# Patient Record
Sex: Male | Born: 1935 | Race: White | Hispanic: No | Smoking: Never smoker
Health system: Southern US, Community
[De-identification: ages and names within clinical notes are randomized; demographics above are authoritative.]

## PROBLEM LIST (undated history)

## (undated) DIAGNOSIS — A809 Acute poliomyelitis, unspecified: Secondary | ICD-10-CM

## (undated) DIAGNOSIS — R413 Other amnesia: Secondary | ICD-10-CM

## (undated) DIAGNOSIS — E7800 Pure hypercholesterolemia, unspecified: Secondary | ICD-10-CM

## (undated) HISTORY — PX: PROSTATE SURGERY: SHX751

---

## 2014-01-05 DIAGNOSIS — H9190 Unspecified hearing loss, unspecified ear: Secondary | ICD-10-CM | POA: Insufficient documentation

## 2014-01-05 DIAGNOSIS — I10 Essential (primary) hypertension: Secondary | ICD-10-CM | POA: Insufficient documentation

## 2014-01-05 DIAGNOSIS — R32 Unspecified urinary incontinence: Secondary | ICD-10-CM | POA: Insufficient documentation

## 2014-01-05 DIAGNOSIS — H6120 Impacted cerumen, unspecified ear: Secondary | ICD-10-CM | POA: Insufficient documentation

## 2014-01-05 DIAGNOSIS — E785 Hyperlipidemia, unspecified: Secondary | ICD-10-CM | POA: Insufficient documentation

## 2014-01-05 DIAGNOSIS — H9113 Presbycusis, bilateral: Secondary | ICD-10-CM | POA: Insufficient documentation

## 2014-01-05 DIAGNOSIS — H6500 Acute serous otitis media, unspecified ear: Secondary | ICD-10-CM | POA: Insufficient documentation

## 2014-01-05 DIAGNOSIS — R339 Retention of urine, unspecified: Secondary | ICD-10-CM | POA: Insufficient documentation

## 2015-06-12 DIAGNOSIS — R413 Other amnesia: Secondary | ICD-10-CM | POA: Insufficient documentation

## 2015-06-12 DIAGNOSIS — I493 Ventricular premature depolarization: Secondary | ICD-10-CM | POA: Insufficient documentation

## 2015-06-12 DIAGNOSIS — R918 Other nonspecific abnormal finding of lung field: Secondary | ICD-10-CM | POA: Insufficient documentation

## 2016-01-28 DIAGNOSIS — R002 Palpitations: Secondary | ICD-10-CM | POA: Insufficient documentation

## 2016-01-28 DIAGNOSIS — Z23 Encounter for immunization: Secondary | ICD-10-CM | POA: Insufficient documentation

## 2016-08-22 ENCOUNTER — Emergency Department (HOSPITAL_BASED_OUTPATIENT_CLINIC_OR_DEPARTMENT_OTHER): Payer: Medicare Other

## 2016-08-22 ENCOUNTER — Emergency Department (HOSPITAL_BASED_OUTPATIENT_CLINIC_OR_DEPARTMENT_OTHER)
Admission: EM | Admit: 2016-08-22 | Discharge: 2016-08-22 | Disposition: A | Discharge status: Home / Self Care | Payer: Medicare Other | Attending: Emergency Medicine | Admitting: Emergency Medicine

## 2016-08-22 ENCOUNTER — Encounter (HOSPITAL_BASED_OUTPATIENT_CLINIC_OR_DEPARTMENT_OTHER): Payer: Self-pay | Admitting: *Deleted

## 2016-08-22 DIAGNOSIS — W101XXA Fall (on)(from) sidewalk curb, initial encounter: Secondary | ICD-10-CM | POA: Diagnosis not present

## 2016-08-22 DIAGNOSIS — Y939 Activity, unspecified: Secondary | ICD-10-CM | POA: Diagnosis not present

## 2016-08-22 DIAGNOSIS — S01111A Laceration without foreign body of right eyelid and periocular area, initial encounter: Secondary | ICD-10-CM | POA: Diagnosis not present

## 2016-08-22 DIAGNOSIS — Y999 Unspecified external cause status: Secondary | ICD-10-CM | POA: Insufficient documentation

## 2016-08-22 DIAGNOSIS — S52044A Nondisplaced fracture of coronoid process of right ulna, initial encounter for closed fracture: Secondary | ICD-10-CM | POA: Insufficient documentation

## 2016-08-22 DIAGNOSIS — F0390 Unspecified dementia without behavioral disturbance: Secondary | ICD-10-CM | POA: Diagnosis not present

## 2016-08-22 DIAGNOSIS — S0990XA Unspecified injury of head, initial encounter: Secondary | ICD-10-CM | POA: Diagnosis not present

## 2016-08-22 DIAGNOSIS — Z23 Encounter for immunization: Secondary | ICD-10-CM | POA: Insufficient documentation

## 2016-08-22 DIAGNOSIS — S6991XA Unspecified injury of right wrist, hand and finger(s), initial encounter: Secondary | ICD-10-CM | POA: Diagnosis present

## 2016-08-22 DIAGNOSIS — W19XXXA Unspecified fall, initial encounter: Secondary | ICD-10-CM

## 2016-08-22 DIAGNOSIS — Y92480 Sidewalk as the place of occurrence of the external cause: Secondary | ICD-10-CM | POA: Diagnosis not present

## 2016-08-22 DIAGNOSIS — Z79899 Other long term (current) drug therapy: Secondary | ICD-10-CM | POA: Insufficient documentation

## 2016-08-22 HISTORY — DX: Acute poliomyelitis, unspecified: A80.9

## 2016-08-22 HISTORY — DX: Other amnesia: R41.3

## 2016-08-22 HISTORY — DX: Pure hypercholesterolemia, unspecified: E78.00

## 2016-08-22 MED ORDER — CYCLOBENZAPRINE HCL 5 MG PO TABS
5.0000 mg | ORAL_TABLET | Freq: Once | ORAL | Status: AC
  Administered 2016-08-22: 5 mg via ORAL
  Filled 2016-08-22: qty 1

## 2016-08-22 MED ORDER — TETANUS-DIPHTH-ACELL PERTUSSIS 5-2.5-18.5 LF-MCG/0.5 IM SUSP
0.5000 mL | Freq: Once | INTRAMUSCULAR | Status: AC
  Administered 2016-08-22: 0.5 mL via INTRAMUSCULAR
  Filled 2016-08-22: qty 0.5

## 2016-08-22 MED ORDER — CYCLOBENZAPRINE HCL 10 MG PO TABS: 10.0000 mg | ORAL_TABLET | Freq: Two times a day (BID) | ORAL | 0 refills | Status: AC | PRN

## 2016-08-22 NOTE — ED Provider Notes (Addendum)
[place] [facility] Provider Note   CSN: [id number] Arrival date & time: 08/22/16  1122     History   Chief Complaint Chief Complaint  Patient presents with  . Fall    HPI Dustin Villarreal is a 83 y.o. male.  Patient status post fall outside shortly prior to arrival. Patient slipped off a curb. No loss of consciousness no syncope. Patient with complaint of right elbow pain and abrasion to his right eyebrow area. Again no loss of consciousness. Patient not on blood thinners. Patient denies any leg or hip pain. Also denies any back or neck pain. Tetanus is not up-to-date. Patient has a history of some dementia. He is a retired Engineer, drilling. 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      Past Medical History:  Diagnosis Date  . Hypercholesteremia   . Memory loss   . Polio     There are no active problems to display for this patient.   Past Surgical History:  Procedure Laterality Date  . PROSTATE SURGERY         Home Medications    Prior to Admission medications   Medication Sig Start Date End Date Taking? Authorizing Provider  atorvastatin (LIPITOR) 20 MG tablet Take 20 mg by mouth daily.   Yes Historical Provider, MD  cyclobenzaprine (FLEXERIL) 10 MG tablet Take 1 tablet (10 mg total) by mouth 2 (two) times daily as needed for muscle spasms. 08/22/16   Fredia Sorrow, MD    Family History History reviewed. No pertinent family history.  Social History Social History  Substance Use Topics  . Smoking status: Never Smoker  . Smokeless tobacco: Never Used  . Alcohol use No     Allergies   Patient has no known allergies.   Review of Systems Review of Systems  Constitutional: Negative for diaphoresis.  HENT: Positive for facial swelling. Negative for trouble swallowing.   Eyes: Negative for redness.  Respiratory: Negative for shortness of breath.   Cardiovascular: Negative for chest pain.  Gastrointestinal: Negative  for abdominal pain.  Musculoskeletal: Negative for back pain and neck pain.  Skin: Positive for wound.  Neurological: Positive for headaches.  Hematological: Does not bruise/bleed easily.  Psychiatric/Behavioral: Positive for confusion.     Physical Exam Updated Vital Signs BP (!) 165/83 (BP Location: Left Arm)   Pulse 61   Temp 97.8 F (36.6 C) (Oral)   Resp 14   Ht 5\' 6"  (1.676 m)   Wt 68 kg   SpO2 100%   BMI 24.21 kg/m   Physical Exam  Constitutional: He appears well-developed and well-nourished. No distress.  HENT:  Head: Normocephalic.  Contusion and 7 mm laceration to the right eyebrow.  Eyes: Conjunctivae and EOM are normal. Pupils are equal, round, and reactive to light.  Neck: Normal range of motion. Neck supple.  No posterior tenderness to palpation to the cervical spine area.  Cardiovascular: Normal rate and regular rhythm.   Pulmonary/Chest: Effort normal and breath sounds normal. No respiratory distress.  Abdominal: Soft. Bowel sounds are normal. There is no tenderness.  Musculoskeletal: He exhibits edema and tenderness.  Tenderness to palpation with some swelling in the right antecubital area. Good range of motion of the right elbow but not able to get full extension. Biceps muscle appears to be intact. Distally radial pulse is 2+. Good range of motion of the fingers. Sensation intact distally.  Neurological: He is alert. No cranial nerve deficit or sensory deficit.  He exhibits normal muscle tone. Coordination normal.  Patient has some baseline weakness to left leg secondary to polio.  Nursing note and vitals reviewed.    ED Treatments / Results  Labs (all labs ordered are listed, but only abnormal results are displayed) Labs Reviewed - No data to display  EKG  EKG Interpretation None       Radiology Dg Elbow Complete Right  Result Date: 08/22/2016 CLINICAL DATA:  Acute right elbow pain following fall today. Initial encounter. EXAM: RIGHT ELBOW -  COMPLETE 3+ VIEW COMPARISON:  None. FINDINGS: There is a nondisplaced coronoid process fracture of the ulna. An elbow effusion is noted. No subluxation or dislocation identified. IMPRESSION: Coronoid process fracture. Electronically Signed   By: Margarette Canada M.D.   On: 08/22/2016 13:19   Ct Head Wo Contrast  Result Date: 08/22/2016 CLINICAL DATA:  83 year old male with fall and head injury today. Initial encounter. EXAM: CT HEAD WITHOUT CONTRAST TECHNIQUE: Contiguous axial images were obtained from the base of the skull through the vertex without intravenous contrast. COMPARISON:  None. FINDINGS: Brain: No evidence of acute infarction, hemorrhage, hydrocephalus, extra-axial collection or mass lesion/mass effect. Mild atrophy, chronic small-vessel white matter ischemic changes and remote bilateral basal ganglia infarcts noted. Vascular: Intracranial atherosclerotic calcifications noted. Skull: Normal. Negative for fracture or focal lesion. Sinuses/Orbits: No acute finding. Other: None. IMPRESSION: No evidence of acute intracranial abnormality. Atrophy, chronic small-vessel white matter ischemic changes and remote bilateral basal ganglia infarcts. Electronically Signed   By: Margarette Canada M.D.   On: 08/22/2016 13:38    Procedures Procedures (including critical care time)  Medications Ordered in ED Medications  Tdap (BOOSTRIX) injection 0.5 mL (not administered)  cyclobenzaprine (FLEXERIL) tablet 5 mg (5 mg Oral Given 08/22/16 1439)     Initial Impression / Assessment and Plan / ED Course  I have reviewed the triage vital signs and the nursing notes.  Pertinent labs & imaging results that were available during my care of the patient were reviewed by me and considered in my medical decision making (see chart for details).    Patient status post fall outside no loss of consciousness however did hit right eyebrow. Patient does have some baseline dementia. Patient with a 7 mm laceration to the right  eyebrow area sealed not actively bleeding. Also with complaint of right elbow pain.  Workup shows evidence of a type I nondisplaced coronoid process fracture of the right ulnar. Discussed with Dr. Ninfa Linden. Recommends long-arm 90 splint and follow-up in his office in one week. Patient feels much better after his splinting. Also given a sling to support the splint. Post-splinting Refill to the fingers is normal. Splint done by technician.  Since the fall occurred outside tetanus needs to be updated. Laceration does not require suturing will heal on its own. Head CT was negative. No other significant injuries clinically.  Final Clinical Impressions(s) / ED Diagnoses   Final diagnoses:  Fall, initial encounter  Eyebrow laceration, right, initial encounter  Injury of head, initial encounter  Dementia without behavioral disturbance, unspecified dementia type  Nondisplaced fracture of coronoid process of right ulna, initial encounter for closed fracture    New Prescriptions New Prescriptions   CYCLOBENZAPRINE (FLEXERIL) 10 MG TABLET    Take 1 tablet (10 mg total) by mouth 2 (two) times daily as needed for muscle spasms.     Fredia Sorrow, MD 08/22/16 [place], MD 08/22/16 1558

## 2016-08-22 NOTE — ED Triage Notes (Signed)
Pt slipped off a curb.  Reports right elbow pain and small abrasion above right eye-no bleeding noted.  Denies LOC, no blood thinners.  Denies hip or knee pain.

## 2016-08-22 NOTE — ED Notes (Signed)
Pt transported to CT/Xray. 

## 2016-08-22 NOTE — Discharge Instructions (Signed)
Follow-up with Dr. Ninfa Linden from orthopedics in about one week. Keep the splint in place. Use the sling to help support the splint. Take the Flexeril as needed. Return for any new or worse symptoms. Head CT was negative. Tetanus updated today. Laceration to the right eyebrow area will heal on its own.

## 2016-08-28 ENCOUNTER — Telehealth (INDEPENDENT_AMBULATORY_CARE_PROVIDER_SITE_OTHER): Payer: Self-pay

## 2016-08-28 NOTE — Telephone Encounter (Signed)
This patient is coming in to see you Monday, do I need to bring cast stuff or will he need some sort of splint or anything at all?

## 2016-08-28 NOTE — Telephone Encounter (Signed)
You don't need to bring anything for this one.

## 2016-08-31 ENCOUNTER — Ambulatory Visit (INDEPENDENT_AMBULATORY_CARE_PROVIDER_SITE_OTHER): Payer: Medicare Other | Admitting: Orthopaedic Surgery

## 2016-08-31 DIAGNOSIS — S52044A Nondisplaced fracture of coronoid process of right ulna, initial encounter for closed fracture: Secondary | ICD-10-CM | POA: Insufficient documentation

## 2016-08-31 NOTE — Progress Notes (Signed)
   Office Visit Note   Patient: Dustin Villarreal           Date of Birth: [birth year]           MRN: [id number] Visit Date: 08/31/2016              Requested by: [address] [place], [place] [address] PCP: [place]: Visit Diagnoses:  1. Nondisplaced fracture of coronoid process of right ulna, initial encounter for closed fracture     Plan: We reviewed the x-rays together and I showed  Follow-Up Instructions: Return in about 4 weeks (around 09/28/2016).   Orders:  No orders of the defined types were placed in this encounter.  No orders of the defined types were placed in this encounter.     Procedures: No procedures performed   Clinical Data: No additional findings.   Subjective: No chief complaint on file. Patient is a very pleasant 83 year old gentleman referred from the emergency room due to a right elbow injury. He is in a splint. He injured this after mechanical fall. His daughter is with him. He's been dealing with some dementia issues and some balance and coronation issues. He has had polio in the past. He does report elbow pain but is been comfortable in the splint since his fall. A bigger concern though is his issues with balance and coordination and him being a fall risk.  HPI  Review of Systems Currently denies any chest pain, headache, shortness of breath, fever, chills, nausea, vomiting. There are balance and coordination issues.  Objective: Vital Signs: There were no vitals taken for this visit.  Physical Exam He is alert and follows commands. He ambulates with an assistive device. Ortho Exam He has extensive bruising around his right elbow. He lacks full extension by about 10 and he can fully flex. Rotation is normal. He has normal sensation in his hand and his hand is well-perfused Specialty Comments:  No specialty comments available.  Imaging: No  results found. X-rays on the canopy System and independently reviewed of his right elbow show a type I coronoid fracture with no displacement. He does have cortical irregularities around the biceps tuberosity of the radius to suggest an old injury to the distal biceps tendon.  PMFS History: Patient Active Problem List   Diagnosis Date Noted  . Nondisplaced fracture of coronoid process of right ulna, initial encounter for closed fracture 08/31/2016   Past Medical History:  Diagnosis Date  . Hypercholesteremia   . Memory loss   . Polio     No family history on file.  Past Surgical History:  Procedure Laterality Date  . PROSTATE SURGERY     Social History   Occupational History  . Not on file.   Social History Main Topics  . Smoking status: Never Smoker  . Smokeless tobacco: Never Used  . Alcohol use No  . Drug use: No  . Sexual activity: Not on file

## 2016-09-11 ENCOUNTER — Other Ambulatory Visit (INDEPENDENT_AMBULATORY_CARE_PROVIDER_SITE_OTHER): Payer: Self-pay

## 2016-09-11 DIAGNOSIS — M25521 Pain in right elbow: Secondary | ICD-10-CM

## 2016-09-14 ENCOUNTER — Ambulatory Visit (HOSPITAL_BASED_OUTPATIENT_CLINIC_OR_DEPARTMENT_OTHER)
Admission: RE | Admit: 2016-09-14 | Discharge: 2016-09-14 | Disposition: A | Discharge status: Home / Self Care | Payer: Medicare Other | Source: Ambulatory Visit | Attending: Orthopaedic Surgery | Admitting: Orthopaedic Surgery

## 2016-09-14 ENCOUNTER — Ambulatory Visit (INDEPENDENT_AMBULATORY_CARE_PROVIDER_SITE_OTHER): Payer: Medicare Other | Admitting: Orthopaedic Surgery

## 2016-09-14 DIAGNOSIS — M25521 Pain in right elbow: Secondary | ICD-10-CM | POA: Insufficient documentation

## 2016-09-14 DIAGNOSIS — S52044A Nondisplaced fracture of coronoid process of right ulna, initial encounter for closed fracture: Secondary | ICD-10-CM

## 2016-09-14 DIAGNOSIS — S52001A Unspecified fracture of upper end of right ulna, initial encounter for closed fracture: Secondary | ICD-10-CM | POA: Diagnosis not present

## 2016-09-14 DIAGNOSIS — X58XXXA Exposure to other specified factors, initial encounter: Secondary | ICD-10-CM | POA: Diagnosis not present

## 2016-09-14 DIAGNOSIS — S42401A Unspecified fracture of lower end of right humerus, initial encounter for closed fracture: Secondary | ICD-10-CM | POA: Insufficient documentation

## 2016-09-14 NOTE — Progress Notes (Signed)
The patient is now 3 weeks out from a right elbow injury. He was originally diagnoses nondisplaced coronoid fracture of the proximal ulna. He's been out of the sling now is still having pain. He had a fall again out of bed this weekend.  On examination the elbow is swollen and there is some old bruising. His range of motion though is improved with rotation as well as flexion extension. He lacks full extension by about 10. It is less painful than it was.  X-rays were obtained today and reviewed. The coronoid pieces completely healed down in good position however he can see a nondisplaced fracture line through the distal humerus is more a transcondylar type of fracture that is extra articular. It is nondisplaced. There shows some healing.  He'll still try to use his elbow in terms of range of motion and activities daily living without heavy lifting. I would like to see him back for of left file visit in 4 weeks with a repeat AP and lateral of his right elbow.

## 2016-09-15 ENCOUNTER — Telehealth (INDEPENDENT_AMBULATORY_CARE_PROVIDER_SITE_OTHER): Payer: Self-pay | Admitting: Orthopaedic Surgery

## 2016-09-15 NOTE — Telephone Encounter (Signed)
[facility] requesting visit notes Supporting pt prescription for a walker please.  [phone] fax

## 2016-09-15 NOTE — Telephone Encounter (Signed)
Faxed

## 2016-09-28 ENCOUNTER — Ambulatory Visit (INDEPENDENT_AMBULATORY_CARE_PROVIDER_SITE_OTHER): Payer: Medicare Other | Admitting: Orthopaedic Surgery

## 2016-10-12 ENCOUNTER — Ambulatory Visit (INDEPENDENT_AMBULATORY_CARE_PROVIDER_SITE_OTHER): Payer: Medicare Other | Admitting: Orthopaedic Surgery

## 2017-05-26 DIAGNOSIS — R0989 Other specified symptoms and signs involving the circulatory and respiratory systems: Secondary | ICD-10-CM | POA: Insufficient documentation

## 2017-06-23 DIAGNOSIS — D1030 Benign neoplasm of unspecified part of mouth: Secondary | ICD-10-CM | POA: Insufficient documentation

## 2018-08-15 DIAGNOSIS — S72142A Displaced intertrochanteric fracture of left femur, initial encounter for closed fracture: Secondary | ICD-10-CM | POA: Insufficient documentation

## 2018-08-23 DIAGNOSIS — M25552 Pain in left hip: Secondary | ICD-10-CM | POA: Insufficient documentation

## 2018-09-09 DIAGNOSIS — T17900A Unspecified foreign body in respiratory tract, part unspecified causing asphyxiation, initial encounter: Secondary | ICD-10-CM | POA: Insufficient documentation

## 2018-09-12 DIAGNOSIS — G40909 Epilepsy, unspecified, not intractable, without status epilepticus: Secondary | ICD-10-CM | POA: Insufficient documentation

## 2018-09-12 DIAGNOSIS — Z7409 Other reduced mobility: Secondary | ICD-10-CM | POA: Insufficient documentation

## 2018-09-12 DIAGNOSIS — Z8612 Personal history of poliomyelitis: Secondary | ICD-10-CM | POA: Insufficient documentation

## 2018-10-05 DIAGNOSIS — Z8781 Personal history of (healed) traumatic fracture: Secondary | ICD-10-CM | POA: Insufficient documentation

## 2018-10-11 DIAGNOSIS — I6521 Occlusion and stenosis of right carotid artery: Secondary | ICD-10-CM | POA: Insufficient documentation

## 2019-04-12 IMAGING — DX DG ELBOW COMPLETE 3+V*R*
4 series · 4 of 4 positions shown · non-contrast
Comparison: None.

CLINICAL DATA: Right elbow pain after fall last week.

EXAM:
RIGHT ELBOW - COMPLETE 3+ VIEW

[elbow ap]
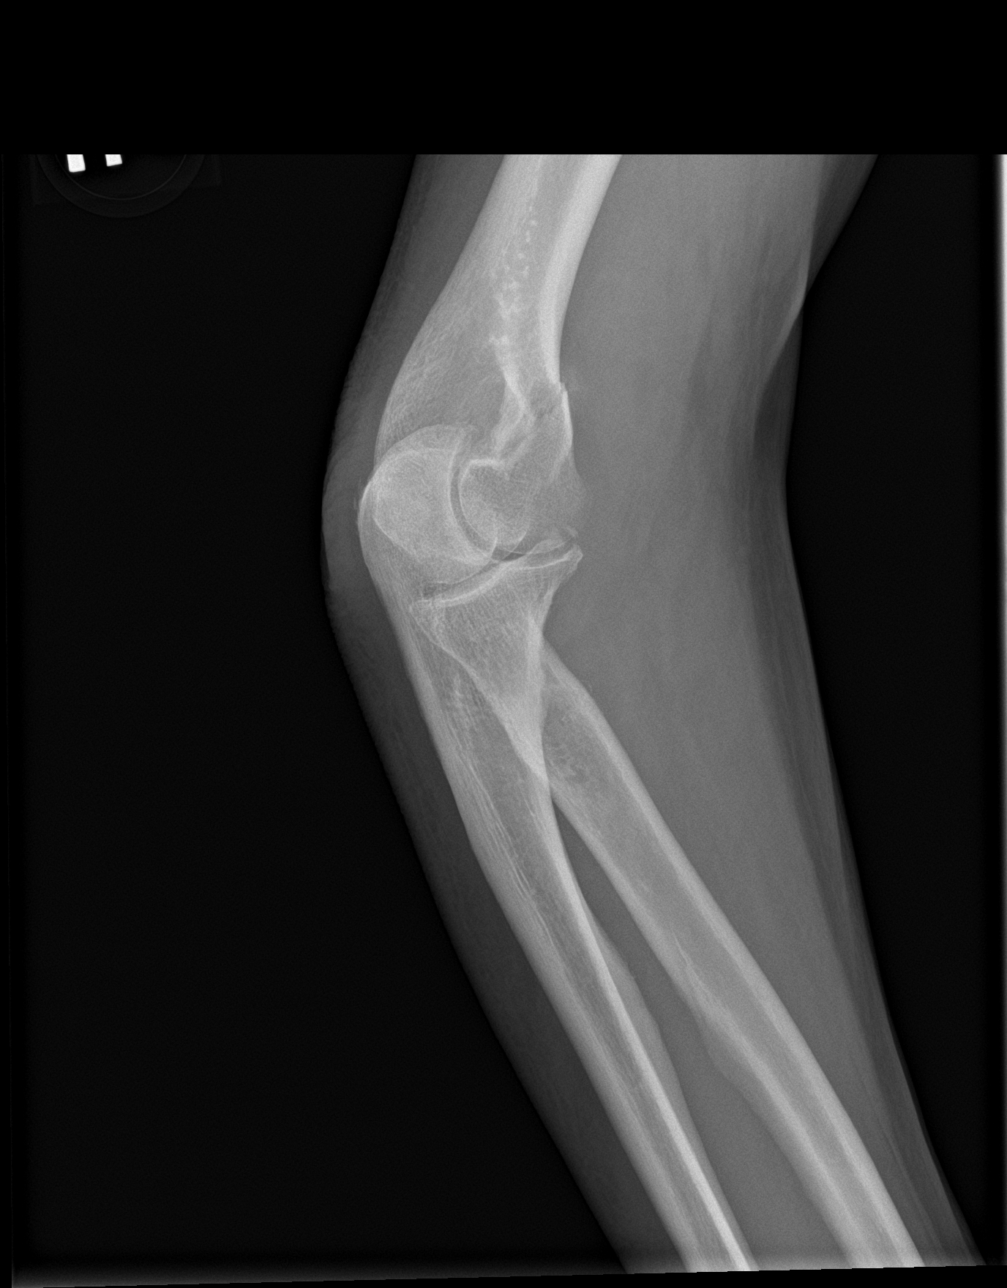

[elbow obl (1 of 2)]
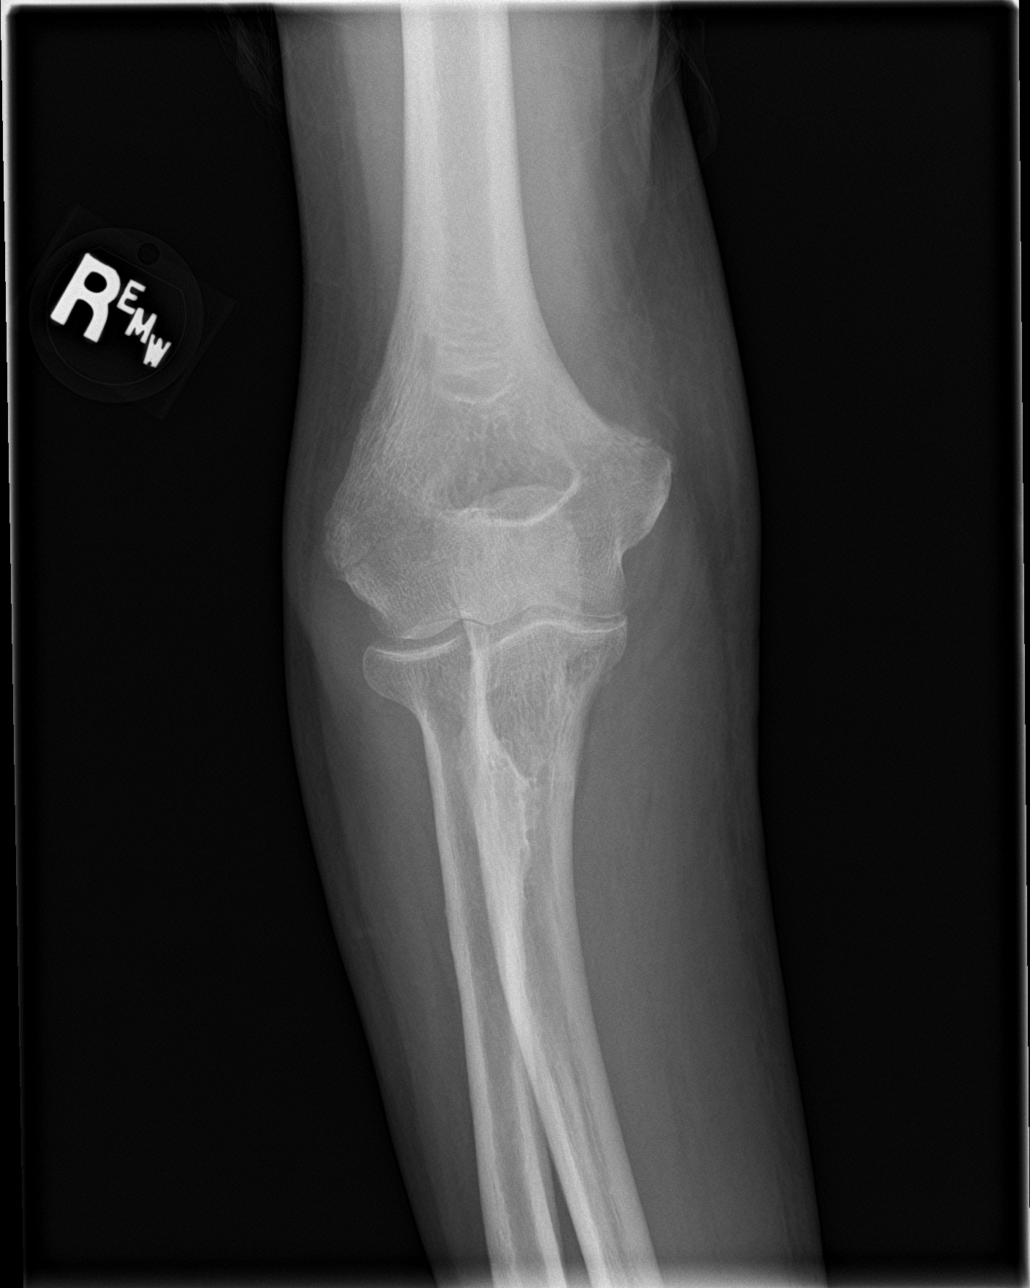

[elbow obl (2 of 2)]
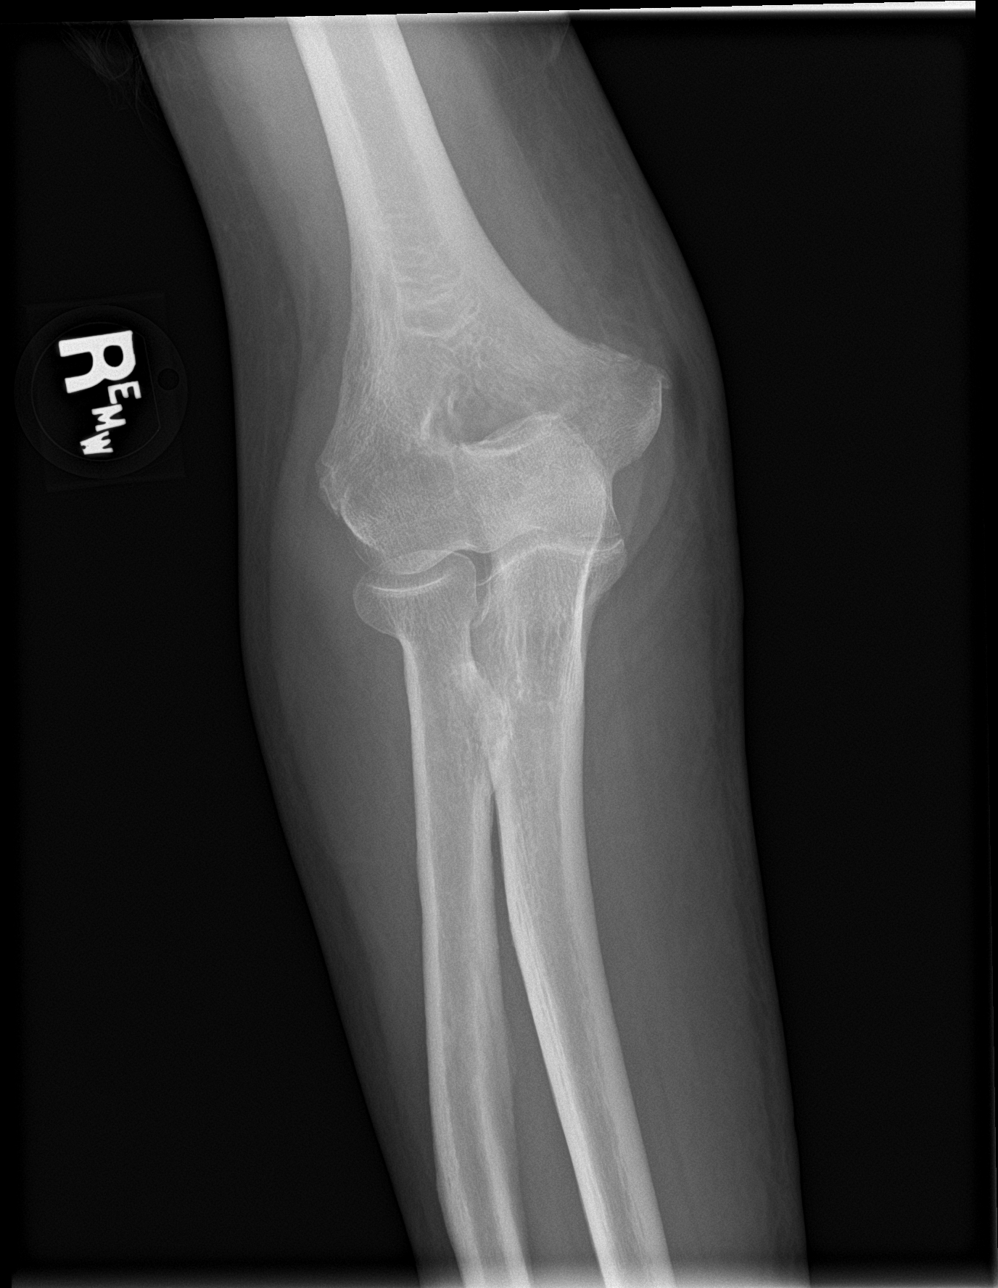

[elbow lat]
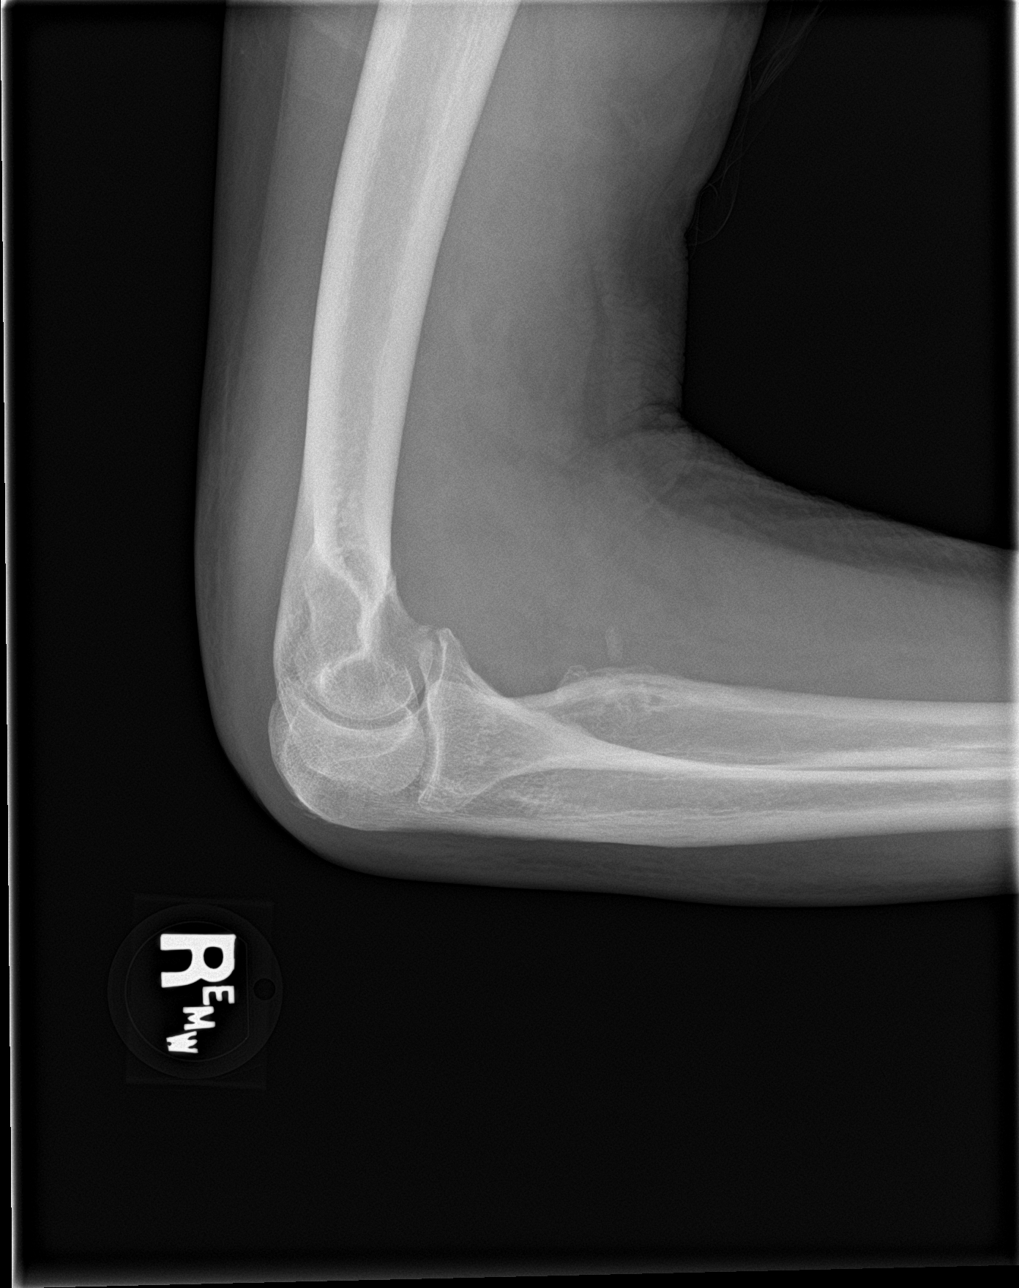

[4 of 4 positions shown; findings below may reference images not displayed]

FINDINGS: The radius appears normal. However, minimally displaced fracture is
seen involving the distal humerus, extending across both the medial
and lateral supracondylar regions. Mildly displaced fracture
involving coronoid process of proximal ulna is noted of
indeterminate age.
IMPRESSION: Minimally displaced distal humeral fracture is noted as described
above. Mildly displaced fracture involving coronoid process of
proximal ulna is noted of indeterminate age.

## 2021-01-28 ENCOUNTER — Ambulatory Visit: Payer: Medicare Other | Admitting: Sports Medicine

## 2022-09-24 DIAGNOSIS — I361 Nonrheumatic tricuspid (valve) insufficiency: Secondary | ICD-10-CM

## 2022-09-24 DIAGNOSIS — I340 Nonrheumatic mitral (valve) insufficiency: Secondary | ICD-10-CM

## 2022-09-24 DIAGNOSIS — I350 Nonrheumatic aortic (valve) stenosis: Secondary | ICD-10-CM

## 2022-10-13 ENCOUNTER — Encounter: Payer: Self-pay | Admitting: Internal Medicine
